# Patient Record
Sex: Male | Born: 1990 | Race: Black or African American | Hispanic: No | Marital: Single | Smoking: Never smoker
Health system: Southern US, Community
[De-identification: ages and names within clinical notes are randomized; demographics above are authoritative.]

## PROBLEM LIST (undated history)

## (undated) DIAGNOSIS — J45909 Unspecified asthma, uncomplicated: Secondary | ICD-10-CM

---

## 2015-07-11 ENCOUNTER — Emergency Department (HOSPITAL_COMMUNITY)
Admission: EM | Admit: 2015-07-11 | Discharge: 2015-07-12 | Disposition: A | Discharge status: Home / Self Care | Payer: BLUE CROSS/BLUE SHIELD | Attending: Emergency Medicine | Admitting: Emergency Medicine

## 2015-07-11 ENCOUNTER — Encounter (HOSPITAL_COMMUNITY): Payer: Self-pay | Admitting: Emergency Medicine

## 2015-07-11 DIAGNOSIS — R300 Dysuria: Secondary | ICD-10-CM

## 2015-07-11 DIAGNOSIS — R369 Urethral discharge, unspecified: Secondary | ICD-10-CM

## 2015-07-11 DIAGNOSIS — J45909 Unspecified asthma, uncomplicated: Secondary | ICD-10-CM | POA: Insufficient documentation

## 2015-07-11 DIAGNOSIS — N4889 Other specified disorders of penis: Secondary | ICD-10-CM | POA: Diagnosis present

## 2015-07-11 HISTORY — DX: Unspecified asthma, uncomplicated: J45.909

## 2015-07-11 MED ORDER — CEFTRIAXONE SODIUM 250 MG IJ SOLR
250.0000 mg | Freq: Once | INTRAMUSCULAR | Status: AC
  Administered 2015-07-11: 250 mg via INTRAMUSCULAR
  Filled 2015-07-11: qty 250

## 2015-07-11 MED ORDER — AZITHROMYCIN 250 MG PO TABS
1000.0000 mg | ORAL_TABLET | Freq: Once | ORAL | Status: AC
  Administered 2015-07-11: 1000 mg via ORAL
  Filled 2015-07-11: qty 4

## 2015-07-11 MED ORDER — METRONIDAZOLE 500 MG PO TABS
2000.0000 mg | ORAL_TABLET | Freq: Once | ORAL | Status: AC
  Administered 2015-07-11: 2000 mg via ORAL
  Filled 2015-07-11: qty 4

## 2015-07-11 NOTE — ED Provider Notes (Signed)
CSN: [id number]     Arrival date & time 07/11/15  2204 History  By signing my name below, I, Majel Homer, attest that this documentation has been prepared under the direction and in the presence of non-physician practitioner, Gaylyn Rong, PA-C. Electronically Signed: Majel Homer, Scribe. 07/11/2015. 11:17 PM.   Chief Complaint  Patient presents with  . Exposure to STD   The history is provided by the patient. No language interpreter was used.  HPI Comments:  Justin Arias is a 25 y.o. male who presents to the Emergency Department complaining of sudden onset, gradually worsening, burning and penile discomfort that began this morning. Pt states he had intercourse last night and used contraception. Pt notes he was driving to work this morning when he began to experience burning sensations from his penis. He also reports associated symptoms of dysuria and discomfort while walking. He states he has a new sexual partner; however, he notes he has had intercourse with her before and has never had unprotected sex with her. He denies allergies to latex, possible tears or holes in his condoms, rash, hematuria, testicular pain, and any recent unprotected sex.   Past Medical History  Diagnosis Date  . Asthma    History reviewed. No pertinent past surgical history. No family history on file. Social History  Substance Use Topics  . Smoking status: Never Smoker   . Smokeless tobacco: None  . Alcohol Use: No    Review of Systems 10 systems reviewed and all are negative for acute change except as noted in the HPI.  Allergies  Review of patient's allergies indicates not on file.  Home Medications   Prior to Admission medications   Not on File   Triage Vitals: BP 140/93 mmHg  Pulse 55  Temp(Src) 98.2 F (36.8 C) (Oral)  Resp 18  SpO2 100% Physical Exam  Constitutional: He is oriented to person, place, and time. He appears well-developed and well-nourished. No distress.  HENT:  Head:  Normocephalic and atraumatic.  Eyes: Conjunctivae are normal. Right eye exhibits no discharge. Left eye exhibits no discharge. No scleral icterus.  Cardiovascular: Normal rate.   Pulmonary/Chest: Effort normal.  Genitourinary:  Thick white discharge from head of penis without penile tenderness No testicular tenderness or swelling  No rash or external genital lesions  Neurological: He is alert and oriented to person, place, and time. Coordination normal.  Skin: Skin is warm and dry. No rash noted. He is not diaphoretic. No erythema. No pallor.  Psychiatric: He has a normal mood and affect. His behavior is normal.  Nursing note and vitals reviewed.  ED Course  Procedures  DIAGNOSTIC STUDIES:  Oxygen Saturation is 100% on RA, normal by my interpretation.    COORDINATION OF CARE:  10:59 PM Discussed treatment plan, which includes urine sample and STD culture tests with pt at bedside and pt agreed to plan. Pt has been advised to not have intercourse until the results of his tests are known.   Labs Review Labs Reviewed - No data to display  Imaging Review No results found. I have personally reviewed and evaluated these images and lab results as part of my medical decision-making.   EKG Interpretation None     MDM   Final diagnoses:  Penile discharge  Dysuria   Pt presents to the ED today with new onset dysuria and penile discharge. Pt reports new sexual partner but states that he has been using protection. However, on exam pt has significant penile discharge., concern for  STD despite use of protection. Patient treated in the ED for STI with azithromycin, rocephin and flagyl for trichomoniasis. Patient advised to inform and treat all sexual partners.  Pt advised on safe sex practices and understands that they have GC/Chlamydia cultures pending and will result in 2-3 days. Pt encouraged to follow up at local health department for future STI checks. No concern for prostatitis or  epididymitis. Discussed return precautions. Pt appears safe for discharge.    MDM Number of Diagnoses or Management Options  I personally performed the services described in this documentation, which was scribed in my presence. The recorded information has been reviewed and is accurate.   Lester KinsmanSamantha Tripp [place], PA-C 07/12/15 0121  Zadie Rhineonald Wickline, MD 07/12/15 [phone]

## 2015-07-11 NOTE — ED Notes (Signed)
Pt c/o burning and itching to penis.  Onset earlier today

## 2015-07-12 LAB — URINALYSIS, ROUTINE W REFLEX MICROSCOPIC
Bilirubin Urine: NEGATIVE
Glucose, UA: NEGATIVE mg/dL
Hgb urine dipstick: NEGATIVE
Ketones, ur: NEGATIVE mg/dL
Nitrite: NEGATIVE
Protein, ur: NEGATIVE mg/dL
Specific Gravity, Urine: 1.028 (ref 1.005–1.030)
pH: 6 (ref 5.0–8.0)

## 2015-07-12 LAB — GC/CHLAMYDIA PROBE AMP (~~LOC~~) NOT AT ARMC
Chlamydia: NEGATIVE
Neisseria Gonorrhea: POSITIVE — AB

## 2015-07-12 LAB — URINE MICROSCOPIC-ADD ON

## 2015-07-12 NOTE — Discharge Instructions (Signed)
Dysuria Dysuria is pain or discomfort while urinating. The pain or discomfort may be felt in the tube that carries urine out of the bladder (urethra) or in the surrounding tissue of the genitals. The pain may also be felt in the groin area, lower abdomen, and lower back. You may have to urinate frequently or have the sudden feeling that you have to urinate (urgency). Dysuria can affect both men and women, but is more common in women. Dysuria can be caused by many different things, including:  Urinary tract infection in women.  Infection of the kidney or bladder.  Kidney stones or bladder stones.  Certain sexually transmitted infections (STIs), such as chlamydia.  Dehydration.  Inflammation of the vagina.  Use of certain medicines.  Use of certain soaps or scented products that cause irritation. HOME CARE INSTRUCTIONS Watch your dysuria for any changes. The following actions may help to reduce any discomfort you are feeling:  Drink enough fluid to keep your urine clear or pale yellow.  Empty your bladder often. Avoid holding urine for long periods of time.  After a bowel movement or urination, women should cleanse from front to back, using each tissue only once.  Empty your bladder after sexual intercourse.  Take medicines only as directed by your health care provider.  If you were prescribed an antibiotic medicine, finish it all even if you start to feel better.  Avoid caffeine, tea, and alcohol. They can irritate the bladder and make dysuria worse. In men, alcohol may irritate the prostate.  Keep all follow-up visits as directed by your health care provider. This is important.  If you had any tests done to find the cause of dysuria, it is your responsibility to obtain your test results. Ask the lab or department performing the test when and how you will get your results. Talk with your health care provider if you have any questions about your results. SEEK MEDICAL CARE  IF:  You develop pain in your back or sides.  You have a fever.  You have nausea or vomiting.  You have blood in your urine.  You are not urinating as often as you usually do. SEEK IMMEDIATE MEDICAL CARE IF:  You pain is severe and not relieved with medicines.  You are unable to hold down any fluids.  You or someone else notices a change in your mental function.  You have a rapid heartbeat at rest.  You have shaking or chills.  You feel extremely weak.   This information is not intended to replace advice given to you by your health care provider. Make sure you discuss any questions you have with your health care provider.  You have gonorrhea and chlamydia cultures pending. These will result in approximately 72 hours. Follow up with the local health department as needed. Return to the ED if you experience severe worsening of your symptoms, abdominal pain, blood in urine, fevers, chills.

## 2015-07-12 NOTE — ED Notes (Signed)
Pt stable, ambulatory, states understanding of discharge instructions 

## 2015-07-13 LAB — URINE CULTURE: Culture: NO GROWTH

## 2015-07-15 ENCOUNTER — Telehealth (HOSPITAL_BASED_OUTPATIENT_CLINIC_OR_DEPARTMENT_OTHER): Payer: Self-pay | Admitting: Emergency Medicine
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
